# Patient Record
Sex: Female | Born: 2000 | Race: Black or African American | Hispanic: No | Marital: Single | Smoking: Never smoker
Health system: Southern US, Community
[De-identification: ages and names within clinical notes are randomized; demographics above are authoritative.]

---
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
## 2019-06-03 ENCOUNTER — Other Ambulatory Visit: Payer: Self-pay

## 2019-06-03 DIAGNOSIS — Z20822 Contact with and (suspected) exposure to covid-19: Secondary | ICD-10-CM

## 2019-06-04 LAB — NOVEL CORONAVIRUS, NAA: SARS-CoV-2, NAA: NOT DETECTED

## 2019-10-01 ENCOUNTER — Other Ambulatory Visit: Payer: Self-pay

## 2019-10-01 DIAGNOSIS — Z20822 Contact with and (suspected) exposure to covid-19: Secondary | ICD-10-CM

## 2019-10-02 LAB — NOVEL CORONAVIRUS, NAA: SARS-CoV-2, NAA: NOT DETECTED
